# Patient Record
Sex: Female | Born: 1990 | Race: Black or African American | Hispanic: No | Marital: Single | Smoking: Never smoker
Health system: Southern US, Community
[De-identification: ages and names within clinical notes are randomized; demographics above are authoritative.]

---

## 2015-12-24 ENCOUNTER — Encounter (HOSPITAL_COMMUNITY): Payer: Self-pay

## 2015-12-24 ENCOUNTER — Emergency Department (HOSPITAL_COMMUNITY)
Admission: EM | Admit: 2015-12-24 | Discharge: 2015-12-24 | Disposition: A | Discharge status: Home / Self Care | Payer: PRIVATE HEALTH INSURANCE | Attending: Emergency Medicine | Admitting: Emergency Medicine

## 2015-12-24 DIAGNOSIS — Z3202 Encounter for pregnancy test, result negative: Secondary | ICD-10-CM | POA: Insufficient documentation

## 2015-12-24 DIAGNOSIS — R202 Paresthesia of skin: Secondary | ICD-10-CM | POA: Insufficient documentation

## 2015-12-24 DIAGNOSIS — R29818 Other symptoms and signs involving the nervous system: Secondary | ICD-10-CM | POA: Diagnosis present

## 2015-12-24 DIAGNOSIS — R200 Anesthesia of skin: Secondary | ICD-10-CM | POA: Diagnosis not present

## 2015-12-24 LAB — CBC WITH DIFFERENTIAL/PLATELET
Basophils Absolute: 0 10*3/uL (ref 0.0–0.1)
Basophils Relative: 0 %
EOS ABS: 0.3 10*3/uL (ref 0.0–0.7)
EOS PCT: 4 %
HCT: 35 % — ABNORMAL LOW (ref 36.0–46.0)
Hemoglobin: 11.4 g/dL — ABNORMAL LOW (ref 12.0–15.0)
LYMPHS ABS: 1.4 10*3/uL (ref 0.7–4.0)
Lymphocytes Relative: 18 %
MCH: 28.3 pg (ref 26.0–34.0)
MCHC: 32.6 g/dL (ref 30.0–36.0)
MCV: 86.8 fL (ref 78.0–100.0)
MONO ABS: 0.4 10*3/uL (ref 0.1–1.0)
MONOS PCT: 5 %
Neutro Abs: 5.5 10*3/uL (ref 1.7–7.7)
Neutrophils Relative %: 73 %
PLATELETS: 309 10*3/uL (ref 150–400)
RBC: 4.03 MIL/uL (ref 3.87–5.11)
RDW: 13.7 % (ref 11.5–15.5)
WBC: 7.6 10*3/uL (ref 4.0–10.5)

## 2015-12-24 LAB — BASIC METABOLIC PANEL
ANION GAP: 8 (ref 5–15)
BUN: 6 mg/dL (ref 6–20)
CHLORIDE: 103 mmol/L (ref 101–111)
CO2: 22 mmol/L (ref 22–32)
Calcium: 8.1 mg/dL — ABNORMAL LOW (ref 8.9–10.3)
Creatinine, Ser: 0.81 mg/dL (ref 0.44–1.00)
GFR calc Af Amer: >60 mL/min (ref >60)
GLUCOSE: 86 mg/dL (ref 65–99)
POTASSIUM: 3.4 mmol/L — AB (ref 3.5–5.1)
Sodium: 133 mmol/L — ABNORMAL LOW (ref 135–145)

## 2015-12-24 LAB — POC URINE PREG, ED: Preg Test, Ur: NEGATIVE

## 2015-12-24 LAB — MAGNESIUM: Magnesium: 1.6 mg/dL — ABNORMAL LOW (ref 1.7–2.4)

## 2015-12-24 MED ORDER — POTASSIUM CHLORIDE CRYS ER 20 MEQ PO TBCR
40.0000 meq | EXTENDED_RELEASE_TABLET | Freq: Once | ORAL | Status: AC
  Administered 2015-12-24: 40 meq via ORAL
  Filled 2015-12-24: qty 2

## 2015-12-24 MED ORDER — MAGNESIUM OXIDE 400 (241.3 MG) MG PO TABS
800.0000 mg | ORAL_TABLET | Freq: Once | ORAL | Status: AC
  Administered 2015-12-24: 800 mg via ORAL
  Filled 2015-12-24: qty 2

## 2015-12-24 NOTE — ED Provider Notes (Signed)
CSN: [id number]     Arrival date & time 12/24/15  0932 History   First MD Initiated Contact with Patient 12/24/15 [phone]     Chief Complaint  Patient presents with  . Neurologic Problem     (Consider location/radiation/quality/duration/timing/severity/associated sxs/prior Treatment) HPI Comments: Woke up this AM Felt numbness in both hands and both feet Yesterday went to park, not sure if bit by anything Has been constant numbness since waking up, now pain going Whole hand Fingers, palms, worse with driving   Patient is a 25 y.o. female presenting with neurologic complaint.  Neurologic Problem Pertinent negatives include no chest pain, no abdominal pain, no headaches and no shortness of breath.    History reviewed. No pertinent past medical history. History reviewed. No pertinent past surgical history. History reviewed. No pertinent family history. Social History  Substance Use Topics  . Smoking status: Never Smoker   . Smokeless tobacco: None  . Alcohol Use: 1.2 oz/week    2 Shots of liquor per week     Comment: monthly, drank yesterday   OB History    Gravida Para Term Preterm AB TAB SAB Ectopic Multiple Living   1    1          Review of Systems  Constitutional: Negative for fever.  HENT: Negative for sore throat.   Eyes: Negative for visual disturbance.  Respiratory: Negative for cough and shortness of breath.   Cardiovascular: Negative for chest pain.  Gastrointestinal: Negative for abdominal pain.  Genitourinary: Negative for difficulty urinating.  Musculoskeletal: Negative for back pain and neck pain.  Skin: Negative for rash.  Neurological: Negative for syncope and headaches.      Allergies  Review of patient's allergies indicates no known allergies.  Home Medications   Prior to Admission medications   Not on File   BP 119/77 mmHg  Pulse 65  Temp(Src) 99 F (37.2 C) (Oral)  Resp 14  Ht  (1.626 m)  Wt 161 lb (73.029 kg)  BMI 27.62 kg/m2   SpO2 100%  LMP 12/04/2015 Physical Exam  Constitutional: She is oriented to person, place, and time. She appears well-developed and well-nourished. No distress.  HENT:  Head: Normocephalic and atraumatic.  Eyes: Conjunctivae and EOM are normal.  Neck: Normal range of motion.  Cardiovascular: Normal rate, regular rhythm, normal heart sounds and intact distal pulses.  Exam reveals no gallop and no friction rub.   No murmur heard. Pulmonary/Chest: Effort normal and breath sounds normal. No respiratory distress. She has no wheezes. She has no rales.  Abdominal: Soft. She exhibits no distension. There is no tenderness. There is no guarding.  Musculoskeletal: She exhibits no edema or tenderness.  Neurological: She is alert and oriented to person, place, and time. She has normal strength. A sensory deficit (reports altered sensation in all areas of bilateral hands, bilateral feet) is present. Coordination normal. GCS eye subscore is 4. GCS verbal subscore is 5. GCS motor subscore is 6.  Skin: Skin is warm and dry. No rash noted. She is not diaphoretic. No erythema.  Nursing note and vitals reviewed.   ED Course  Procedures (including critical care time) Labs Review Labs Reviewed - No data to display  Imaging Review No results found. I have personally reviewed and evaluated these images and lab results as part of my medical decision-making.   EKG Interpretation None      MDM   Final diagnoses:  None   25 year old with no medical history presents  with concern for numbness and tingling of her bilateral hands and feet. Labs are obtained to evaluate for electrolyte abnormalities and showed mild hypokalemia and hypomagnesemia.  Given po replacement. Patient does not have a history of diabetes, alcoholism as cause of peripheral neuropathy. Doubt acute intracranial abnormality resulting in symptoms given distribution. Neurologic exam with exception of bilateral numbness WNL. No weakness and  bilateral UE and LE and doubt Guillain-Barre. Distribution not consistent with carpal tunnel in arms. Patient is under stress, which may contribute to symptoms, however discussed need for close outpatient follow up and provided number for neurology if symptoms do not improve.     Alvira Monday, MD 12/24/15 1910

## 2015-12-24 NOTE — ED Notes (Signed)
Pt verbalizes understanding of instructions. 

## 2015-12-24 NOTE — Discharge Instructions (Signed)
Peripheral Neuropathy Peripheral neuropathy is a type of nerve damage. It affects nerves that carry signals between the spinal cord and other parts of the body. These are called peripheral nerves. With peripheral neuropathy, one nerve or a group of nerves may be damaged.  CAUSES  Many things can damage peripheral nerves. For some people with peripheral neuropathy, the cause is unknown. Some causes include:  Diabetes. This is the most common cause of peripheral neuropathy.  Injury to a nerve.  Pressure or stress on a nerve that lasts a long time.  Too little vitamin B. Alcoholism can lead to this.  Infections.  Autoimmune diseases, such as multiple sclerosis and systemic lupus erythematosus.  Inherited nerve diseases.  Some medicines, such as cancer drugs.  Toxic substances, such as lead and mercury.  Too little blood flowing to the legs.  Kidney disease.  Thyroid disease. SIGNS AND SYMPTOMS  Different people have different symptoms. The symptoms you have will depend on which of your nerves is damaged. Common symptoms include:  Loss of feeling (numbness) in the feet and hands.  Tingling in the feet and hands.  Pain that burns.  Very sensitive skin.  Weakness.  Not being able to move a part of the body (paralysis).  Muscle twitching.  Clumsiness or poor coordination.  Loss of balance.  Not being able to control your bladder.  Feeling dizzy.  Sexual problems. DIAGNOSIS  Peripheral neuropathy is a symptom, not a disease. Finding the cause of peripheral neuropathy can be hard. To figure that out, your health care provider will take a medical history and do a physical exam. A neurological exam will also be done. This involves checking things affected by your brain, spinal cord, and nerves (nervous system). For example, your health care provider will check your reflexes, how you move, and what you can feel.  Other types of tests may also be ordered, such as:  Blood  tests.  A test of the fluid in your spinal cord.  Imaging tests, such as CT scans or an MRI.  Electromyography (EMG). This test checks the nerves that control muscles.  Nerve conduction velocity tests. These tests check how fast messages pass through your nerves.  Nerve biopsy. A small piece of nerve is removed. It is then checked under a microscope. TREATMENT   Medicine is often used to treat peripheral neuropathy. Medicines may include:  Pain-relieving medicines. Prescription or over-the-counter medicine may be suggested.  Antiseizure medicine. This may be used for pain.  Antidepressants. These also may help ease pain from neuropathy.  Lidocaine. This is a numbing medicine. You might wear a patch or be given a shot.  Mexiletine. This medicine is typically used to help control irregular heart rhythms.  Surgery. Surgery may be needed to relieve pressure on a nerve or to destroy a nerve that is causing pain.  Physical therapy to help movement.  Assistive devices to help movement. HOME CARE INSTRUCTIONS   Only take over-the-counter or prescription medicines as directed by your health care provider. Follow the instructions carefully for any given medicines. Do not take any other medicines without first getting approval from your health care provider.  If you have diabetes, work closely with your health care provider to keep your blood sugar under control.  If you have numbness in your feet:  Check every day for signs of injury or infection. Watch for redness, warmth, and swelling.  Wear padded socks and comfortable shoes. These help protect your feet.  Do not do  things that put pressure on your damaged nerve.  Do not smoke. Smoking keeps blood from getting to damaged nerves.  Avoid or limit alcohol. Too much alcohol can cause a lack of B vitamins. These vitamins are needed for healthy nerves.  Develop a good support system. Coping with peripheral neuropathy can be  stressful. Talk to a mental health specialist or join a support group if you are struggling.  Follow up with your health care provider as directed. SEEK MEDICAL CARE IF:   You have new signs or symptoms of peripheral neuropathy.  You are struggling emotionally from dealing with peripheral neuropathy.  You have a fever. SEEK IMMEDIATE MEDICAL CARE IF:   You have an injury or infection that is not healing.  You feel very dizzy or begin vomiting.  You have chest pain.  You have trouble breathing.   This information is not intended to replace advice given to you by your health care provider. Make sure you discuss any questions you have with your health care provider.   Document Released: 10/12/2002 Document Revised: 07/04/2011 Document Reviewed: 06/29/2013 Elsevier Interactive Patient Education 2016 Else[employer] Inc[employer]  Paresthesia Paresthesia is an abnormal burning or prickling sensation. This sensation is generally felt in the hands, arms, legs, or feet. However, it may occur in any part of the body. Usually, it is not painful. The feeling may be described as:  Tingling or numbness.  Pins and needles.  Skin crawling.  Buzzing.  Limbs falling asleep.  Itching. Most people experience temporary (transient) paresthesia at some time in their lives. Paresthesia may occur when you breathe too quickly (hyperventilation). It can also occur without any apparent cause. Commonly, paresthesia occurs when pressure is placed on a nerve. The sensation quickly goes away after the pressure is removed. For some people, however, paresthesia is a long-lasting (chronic) condition that is caused by an underlying disorder. If you continue to have paresthesia, you may need further medical evaluation. HOME CARE INSTRUCTIONS Watch your condition for any changes. Taking the following actions may help to lessen any discomfort that you are feeling:  Avoid drinking alcohol.  Try acupuncture or massage to help  relieve your symptoms.  Keep all follow-up visits as directed by your health care provider. This is important. SEEK MEDICAL CARE IF:  You continue to have episodes of paresthesia.  Your burning or prickling feeling gets worse when you walk.  You have pain, cramps, or dizziness.  You develop a rash. SEEK IMMEDIATE MEDICAL CARE IF:  You feel weak.  You have trouble walking or moving.  You have problems with speech, understanding, or vision.  You feel confused.  You cannot control your bladder or bowel movements.  You have numbness after an injury.  You faint.   This information is not intended to replace advice given to you by your health care provider. Make sure you discuss any questions you have with your health care provider.   Document Released: 10/12/2002 Document Revised: 03/08/2015 Document Reviewed: 10/18/2014 Elsevier Interactive Patient Education [employer]

## 2015-12-24 NOTE — ED Notes (Signed)
Pt c/o numbness and pain at bilat hands and feet. Noted on waking this AM.

## 2019-07-27 ENCOUNTER — Encounter (HOSPITAL_COMMUNITY): Payer: Self-pay | Admitting: Emergency Medicine

## 2019-07-27 ENCOUNTER — Other Ambulatory Visit: Payer: Self-pay

## 2019-07-27 ENCOUNTER — Emergency Department (HOSPITAL_COMMUNITY)
Admission: EM | Admit: 2019-07-27 | Discharge: 2019-07-28 | Disposition: A | Discharge status: Home / Self Care | Payer: BC Managed Care – PPO | Attending: Emergency Medicine | Admitting: Emergency Medicine

## 2019-07-27 DIAGNOSIS — S6992XA Unspecified injury of left wrist, hand and finger(s), initial encounter: Secondary | ICD-10-CM | POA: Diagnosis present

## 2019-07-27 DIAGNOSIS — Y999 Unspecified external cause status: Secondary | ICD-10-CM | POA: Diagnosis not present

## 2019-07-27 DIAGNOSIS — W19XXXA Unspecified fall, initial encounter: Secondary | ICD-10-CM | POA: Diagnosis not present

## 2019-07-27 DIAGNOSIS — S52572A Other intraarticular fracture of lower end of left radius, initial encounter for closed fracture: Secondary | ICD-10-CM | POA: Insufficient documentation

## 2019-07-27 DIAGNOSIS — Y939 Activity, unspecified: Secondary | ICD-10-CM | POA: Diagnosis not present

## 2019-07-27 DIAGNOSIS — Y929 Unspecified place or not applicable: Secondary | ICD-10-CM | POA: Insufficient documentation

## 2019-07-27 NOTE — ED Triage Notes (Signed)
Pt c/o left wrist pain and swelling after falling tonight. Denies hitting her head, no LOC.

## 2019-07-28 ENCOUNTER — Emergency Department (HOSPITAL_COMMUNITY): Payer: BC Managed Care – PPO

## 2019-07-28 MED ORDER — IBUPROFEN 400 MG PO TABS
600.0000 mg | ORAL_TABLET | Freq: Once | ORAL | Status: AC
  Administered 2019-07-28: 600 mg via ORAL
  Filled 2019-07-28: qty 1

## 2019-07-28 MED ORDER — OXYCODONE-ACETAMINOPHEN 5-325 MG PO TABS: 1.0000 | ORAL_TABLET | Freq: Three times a day (TID) | ORAL | 0 refills | Status: AC | PRN

## 2019-07-28 NOTE — ED Provider Notes (Signed)
[facility] EMERGENCY DEPARTMENT Provider Note   CSN: [id number] Arrival date & time: 07/27/19  2305     History   Chief Complaint Chief Complaint  Patient presents with   Wrist Pain    HPI Danielle Faulkner is a 28 y.o. female with no pertinent past medical history who presents to the emergency department with a chief complaint of left wrist pain after fall earlier tonight on to her outstretched left hand.  She is right-hand dominant.  She reports subjective numbness in the left fourth and fifth digits as well as pain to the dorsum of the left hand.  No left elbow or shoulder tenderness.  She did not hit her head, syncopal episode, and has had no headache, nausea, or vomiting.  No treatment prior to arrival.  She reports a history of a left wrist fracture when she was 28 years old that did not require hardware to be placed or surgery.     The history is provided by the patient. No language interpreter was used.    History reviewed. No pertinent past medical history.  There are no active problems to display for this patient.   History reviewed. No pertinent surgical history.   OB History    Gravida  1   Para      Term      Preterm      AB  1   Living        SAB      TAB      Ectopic      Multiple      Live Births               Home Medications    Prior to Admission medications   Medication Sig Start Date End Date Taking? Authorizing Provider  norethindrone-ethinyl estradiol-iron (MICROGESTIN FE 1.5/30) 1.5-30 MG-MCG tablet Take 1 tablet by mouth daily at 12 noon.    [provider]  oxyCODONE-acetaminophen (PERCOCET/ROXICET) 5-325 MG tablet Take 1 tablet by mouth every 8 (eight) hours as needed for severe pain. 07/28/19   Retia Cordle A, PA-C  triamcinolone (KENALOG) 0.025 % ointment Apply 1 application topically daily as needed (for exzema).    [provider]    Family History No family history on  file.  Social History Social History   Tobacco Use   Smoking status: Never Smoker   Smokeless tobacco: Never Used  Substance Use Topics   Alcohol use: Yes    Alcohol/week: 2.0 standard drinks    Types: 2 Shots of liquor per week    Comment: monthly, drank yesterday   Drug use: No     Allergies   Patient has no known allergies.   Review of Systems Review of Systems  Constitutional: Negative for activity change.  Respiratory: Negative for shortness of breath.   Cardiovascular: Negative for chest pain.  Gastrointestinal: Negative for abdominal pain.  Musculoskeletal: Positive for arthralgias, joint swelling and myalgias. Negative for back pain and neck stiffness.  Skin: Negative for color change, rash and wound.  Neurological: Positive for numbness. Negative for syncope and weakness.     Physical Exam Updated Vital Signs BP 131/83 (BP Location: Right Arm)    Pulse 77    Temp 98.2 F (36.8 C) (Oral)    Resp 18    LMP 07/25/2019    SpO2 99%   Physical Exam Vitals signs and nursing note reviewed.  Constitutional:      General: She is not in  acute distress. HENT:     Head: Normocephalic.  Eyes:     Conjunctiva/sclera: Conjunctivae normal.  Neck:     Musculoskeletal: Neck supple.  Cardiovascular:     Rate and Rhythm: Normal rate and regular rhythm.     Heart sounds: No murmur. No friction rub. No gallop.   Pulmonary:     Effort: Pulmonary effort is normal. No respiratory distress.  Abdominal:     General: There is no distension.     Palpations: Abdomen is soft.  Musculoskeletal:        General: Tenderness and deformity present.     Comments: Obvious deformity to the left distal forearm.  Radial pulses are 2+ and symmetric.  Full active and passive range of motion of the left elbow.  She is diffusely tender to the dorsum of the left hand without crepitus or focal tenderness.  Sharp and soft sensation are intact to all 4 distal aspects of digits 1 through 5.   Decreased strength against resistance with flexion secondary to pain.  5-5 strength against resistance of all digits with extension.  Good capillary refill of all digits of the left hand.  Skin:    General: Skin is warm.     Findings: No rash.  Neurological:     Mental Status: She is alert.  Psychiatric:        Behavior: Behavior normal.      ED Treatments / Results  Labs (all labs ordered are listed, but only abnormal results are displayed) Labs Reviewed - No data to display  EKG None  Radiology Dg Wrist Complete Left  Result Date: 07/28/2019 CLINICAL DATA:  28 year old female with fall and left wrist pain. EXAM: LEFT WRIST - COMPLETE 3+ VIEW COMPARISON:  None. FINDINGS: There is a comminuted intra-articular fracture of the distal radius with minimal displacement and impaction. No other acute fracture identified. There is no dislocation. The bones are well mineralized. There is soft tissue swelling of the wrist. No radiopaque foreign object or soft tissue gas. IMPRESSION: Comminuted intra-articular fracture of the distal radius. Electronically Signed   By: Elgie Collard M.D.   On: 07/28/2019 00:37   Dg Hand Complete Left  Result Date: 07/28/2019 CLINICAL DATA:  Left hand and wrist pain after fall tonight. EXAM: LEFT HAND - COMPLETE 3+ VIEW COMPARISON:  Wrist radiograph earlier this day, reported separately. FINDINGS: Comminuted distal radius fracture better assessed on recent wrist radiograph. No additional acute fracture of the hand. And alignment and joint spaces are maintained. IMPRESSION: Comminuted distal radius fracture better assessed on recent wrist radiograph. No additional acute fracture of the hand. Electronically Signed   By: Narda Rutherford M.D.   On: 07/28/2019 01:23    Procedures Procedures (including critical care time)  Medications Ordered in ED Medications  ibuprofen (ADVIL) tablet 600 mg (600 mg Oral Given 07/28/19 0144)     Initial Impression /  Assessment and Plan / ED Course  I have reviewed the triage vital signs and the nursing notes.  Pertinent labs & imaging results that were available during my care of the patient were reviewed by me and considered in my medical decision making (see chart for details).        28 year old female with no pertinent past medical history presenting with left wrist pain and obvious deformity after FOOSH earlier tonight.  She did not hit her head and did not have syncope, nausea, vomiting, or headache.  She has decreased strength against resistance with flexion, but is  otherwise neurologically intact.  Although she is reporting subjective numbness, sensation is intact and equal throughout the entire left hand and fingers.  X-ray of the left hand is unremarkable.  X-ray of the left wrist with a comminuted intra-articular fracture of the distal radius with minimal displacement and impaction.  The patient was discussed with Dr. Leonides Schanz, attending physician.  Will place the patient in a sugar tong splint with follow-up with Dr. Grandville Silos with hand surgery.  All questions answered. RICE therapy and pain medication have been given. A 26-month prescription history query was performed using the [place] CSRS prior to discharge.  ER return precautions given.  She is hemodynamically stable and in no acute distress.  Safe for discharge home with outpatient follow-up.   Final Clinical Impressions(s) / ED Diagnoses   Final diagnoses:  Other closed intra-articular fracture of distal end of left radius, initial encounter    ED Discharge Orders         Ordered    oxyCODONE-acetaminophen (PERCOCET/ROXICET) 5-325 MG tablet  Every 8 hours PRN     07/28/19 0201           Marveline Profeta A, PA-C 07/28/19 0746    Ward, Delice Bison, DO 07/28/19 8325

## 2019-07-28 NOTE — Discharge Instructions (Signed)
Thank you for allowing me to care for you today in the Emergency Department.   Please call Dr. Biagio Borg office to schedule follow-up appointment.  I would recommend calling their office tomorrow because it can take several days to be seen.   Keep the splint clean and dry until you are seen by Dr. Grandville Silos.  To shower, cover the area with a plastic bag to keep it from getting wet.  Take 650 mg of Tylenol or 600 mg of ibuprofen with food every 6 hours for pain.  You can alternate between these 2 medications every 3 hours if your pain returns.  For instance, you can take Tylenol at noon, followed by a dose of ibuprofen at 3, followed by second dose of Tylenol and 6.  Try to elevate your arm while you are sitting and resting help with pain and swelling.

## 2019-07-28 NOTE — Progress Notes (Signed)
Orthopedic Tech Progress Note Patient Details:  Danielle Faulkner 01-07-1991 [id number]  Ortho Devices Type of Ortho Device: Arm sling, Sugartong splint Ortho Device/Splint Location: lue Ortho Device/Splint Interventions: Ordered, Application, Adjustment   Post Interventions Patient Tolerated: Well Instructions Provided: Care of device, Adjustment of device   Karolee Stamps 07/28/2019, 2:22 AM

## 2019-07-28 NOTE — ED Notes (Signed)
Pt given orange juice and crackers, ortho tech called

## 2019-07-29 ENCOUNTER — Other Ambulatory Visit: Payer: Self-pay | Admitting: Orthopedic Surgery

## 2020-02-11 ENCOUNTER — Ambulatory Visit: Payer: BC Managed Care – PPO | Attending: Family

## 2020-02-11 DIAGNOSIS — Z23 Encounter for immunization: Secondary | ICD-10-CM

## 2020-02-11 NOTE — Progress Notes (Signed)
   Covid-19 Vaccination Clinic  Name:  Jinger Middlesworth    MRN: [id number] DOB: May 01, 1991  02/11/2020  Ms. Bobst was observed post Covid-19 immunization for 15 minutes without incident. She was provided with Vaccine Information Sheet and instruction to access the V-Safe system.   Ms. Roe was instructed to call 911 with any severe reactions post vaccine: Marland Kitchen Difficulty breathing  . Swelling of face and throat  . A fast heartbeat  . A bad rash all over body  . Dizziness and weakness   Immunizations Administered    Name Date Dose VIS Date Route   Moderna COVID-19 Vaccine 02/11/2020 11:32 AM 0.5 mL 10/06/2019 Intramuscular   Manufacturer: Moderna   Lot: [id number]   NDC: 63817-711-65

## 2020-03-15 ENCOUNTER — Ambulatory Visit: Payer: BC Managed Care – PPO | Attending: Family

## 2020-03-15 DIAGNOSIS — Z23 Encounter for immunization: Secondary | ICD-10-CM

## 2020-03-15 NOTE — Progress Notes (Signed)
   Covid-19 Vaccination Clinic  Name:  Jude Linck    MRN: [id number] DOB: 1991-02-11  03/15/2020  Ms. Mcelroy was observed post Covid-19 immunization for 15 minutes without incident. She was provided with Vaccine Information Sheet and instruction to access the V-Safe system.   Ms. Viall was instructed to call 911 with any severe reactions post vaccine: Marland Kitchen Difficulty breathing  . Swelling of face and throat  . A fast heartbeat  . A bad rash all over body  . Dizziness and weakness   Immunizations Administered    Name Date Dose VIS Date Route   Moderna COVID-19 Vaccine 03/15/2020  1:49 PM 0.5 mL 10/2019 Intramuscular   Manufacturer: Moderna   Lot: [id number]   NDC: 54862-824-17

## 2021-03-22 IMAGING — DX DG HAND COMPLETE 3+V*L*
3 series · 3 of 3 positions shown · non-contrast
Comparison: Wrist radiograph earlier this day, reported separately.

CLINICAL DATA: Left hand and wrist pain after fall tonight.

EXAM:
LEFT HAND - COMPLETE 3+ VIEW

[hand pa]
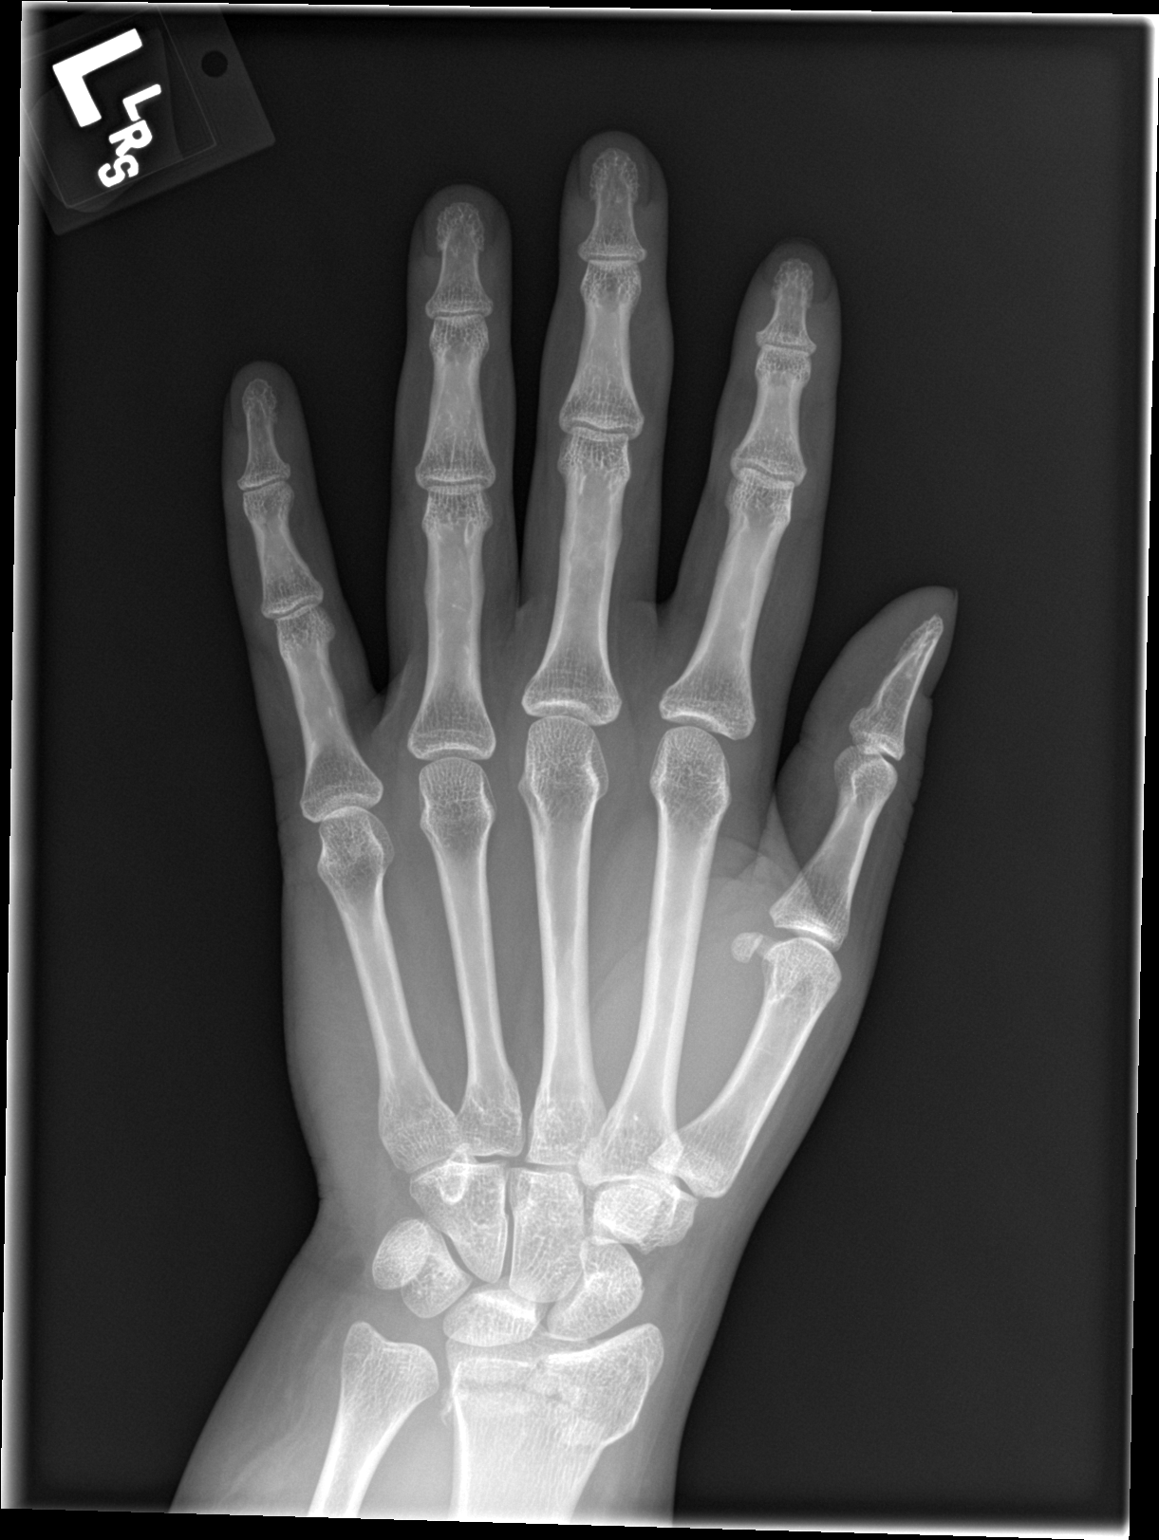

[hand obl]
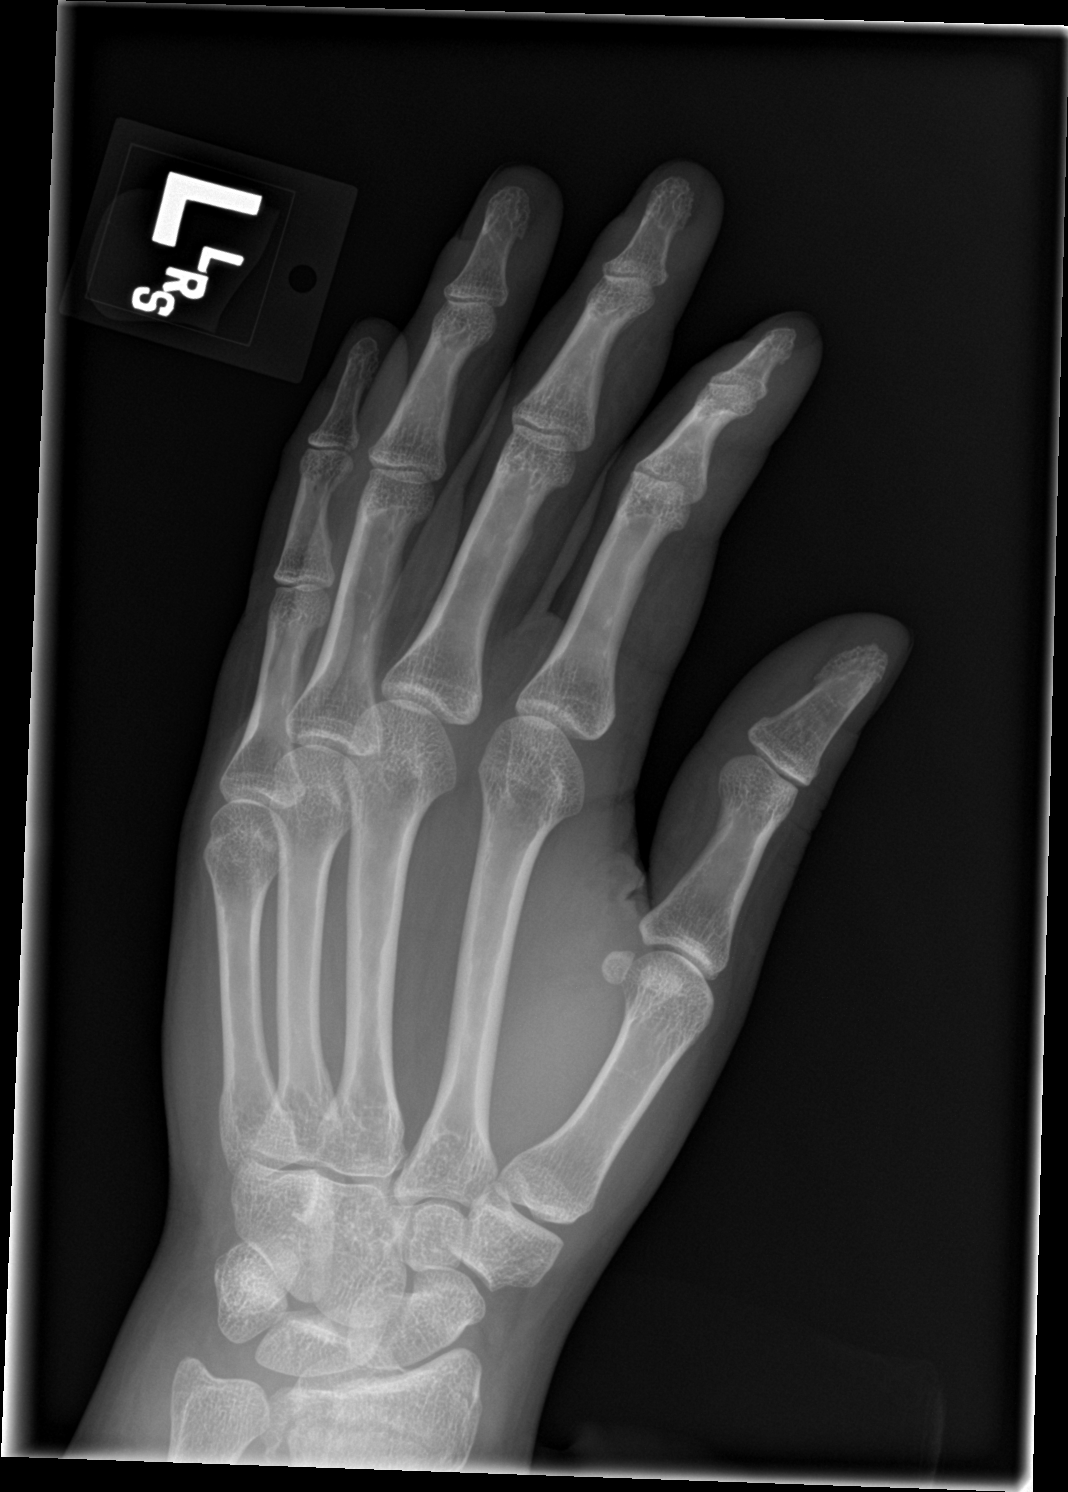

[hand lat]
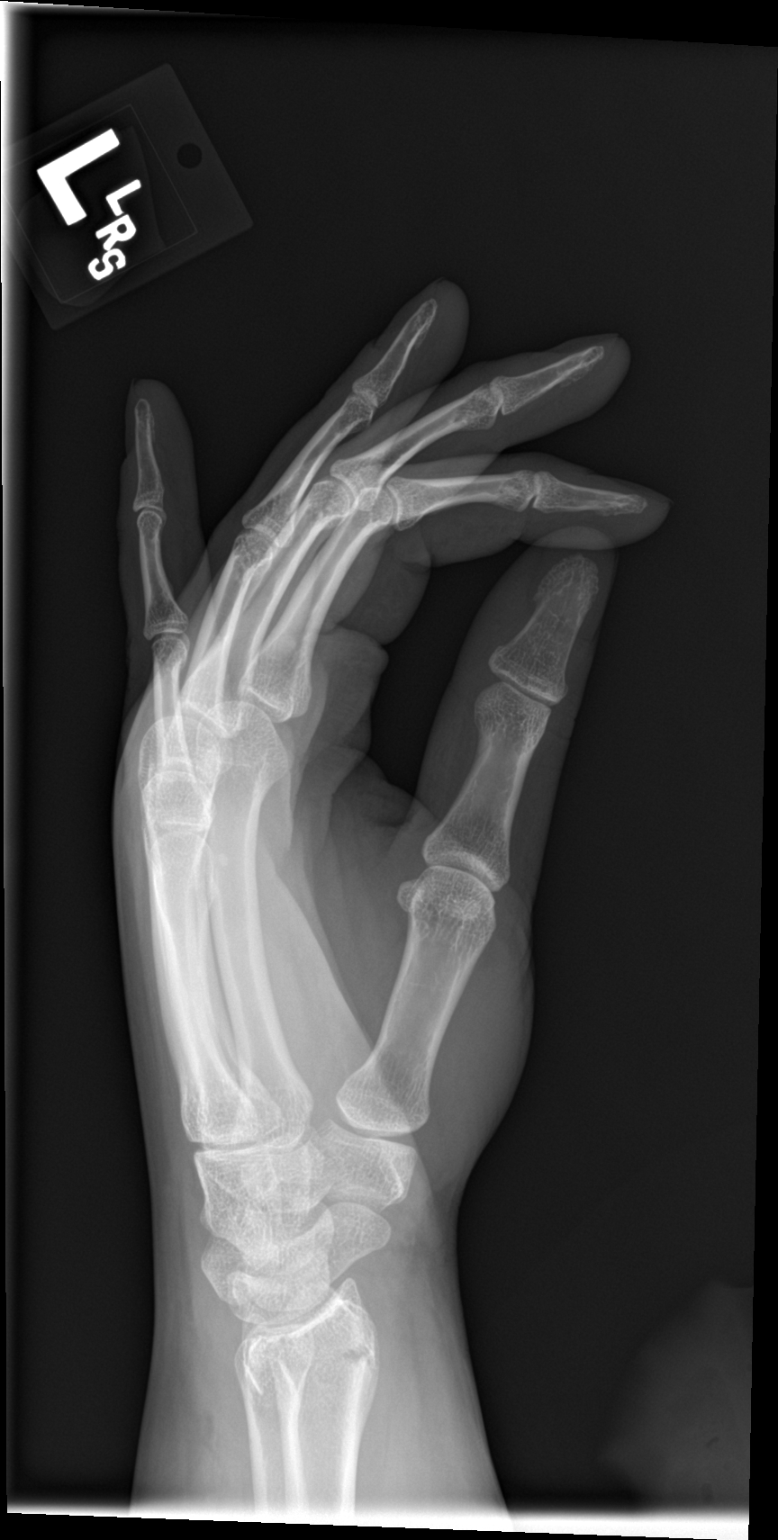

[3 of 3 positions shown; findings below may reference images not displayed]

FINDINGS: Comminuted distal radius fracture better assessed on recent wrist
radiograph. No additional acute fracture of the hand. And alignment
and joint spaces are maintained.
IMPRESSION: Comminuted distal radius fracture better assessed on recent wrist
radiograph. No additional acute fracture of the hand.
